# Patient Record
Sex: Male | Born: 1955 | Race: White | Hispanic: No | Marital: Married | Smoking: Never smoker
Health system: Southern US, Community
[De-identification: ages and names within clinical notes are randomized; demographics above are authoritative.]

## PROBLEM LIST (undated history)

## (undated) DIAGNOSIS — R51 Headache: Secondary | ICD-10-CM

## (undated) DIAGNOSIS — R519 Headache, unspecified: Secondary | ICD-10-CM

## (undated) HISTORY — PX: COLONOSCOPY: SHX5424

---

## 2006-12-15 ENCOUNTER — Emergency Department (HOSPITAL_COMMUNITY): Admission: EM | Admit: 2006-12-15 | Discharge: 2006-12-15 | Payer: Self-pay | Admitting: Emergency Medicine

## 2006-12-21 ENCOUNTER — Ambulatory Visit (HOSPITAL_COMMUNITY): Admission: RE | Admit: 2006-12-21 | Discharge: 2006-12-21 | Payer: Self-pay | Admitting: Family Medicine

## 2007-01-15 ENCOUNTER — Ambulatory Visit (HOSPITAL_COMMUNITY): Admission: RE | Admit: 2007-01-15 | Discharge: 2007-01-15 | Payer: Self-pay | Admitting: Family Medicine

## 2008-07-15 IMAGING — US US ABDOMEN COMPLETE
1 series · 14 of 25 positions shown · non-contrast
Comparison: 12/21/06 CT.

CLINICAL DATA: 50-year-old male, right upper quadrant abdominal pain.  
 ABDOMEN ULTRASOUND:
TECHNIQUE: Complete abdominal ultrasound examination was performed including evaluation of the liver, gallbladder, bile ducts, pancreas, kidneys, spleen, IVC, and abdominal aorta.

[Series 1: abdomen · 0.28mm/px · 14 of 68 slices shown]
[im 1/68]
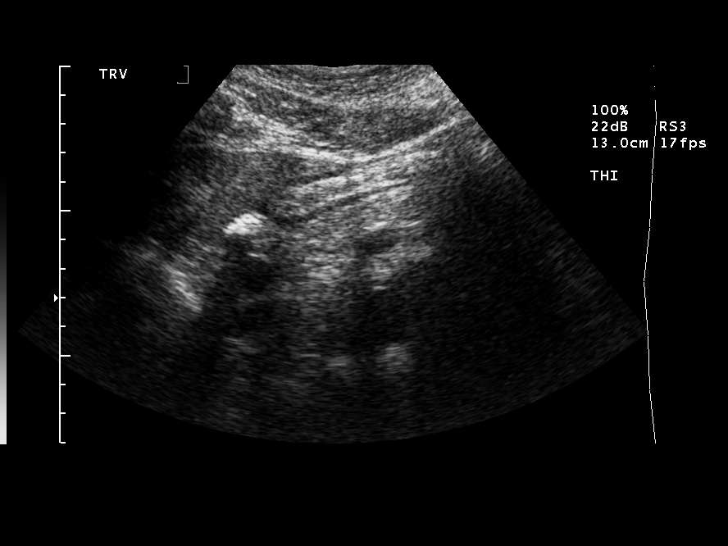
[im 6/68]
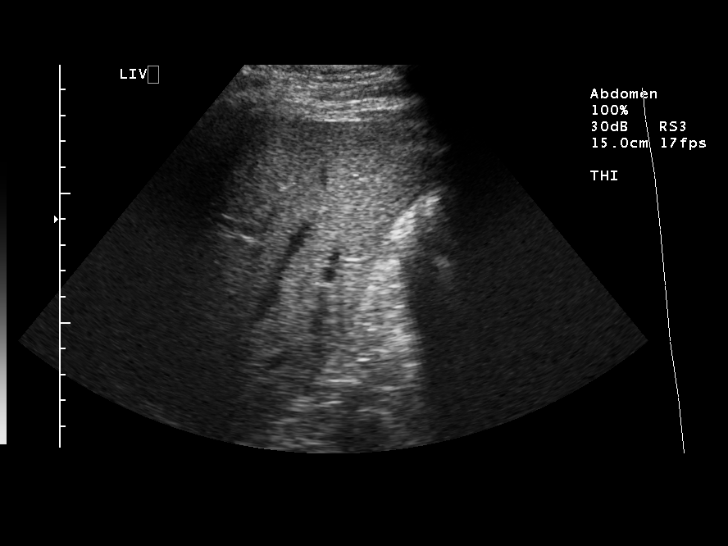
[im 12/68]
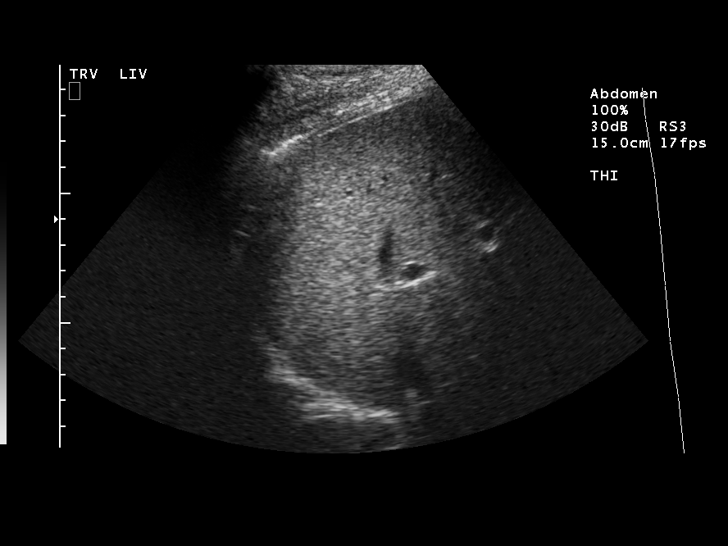
[im 17/68]
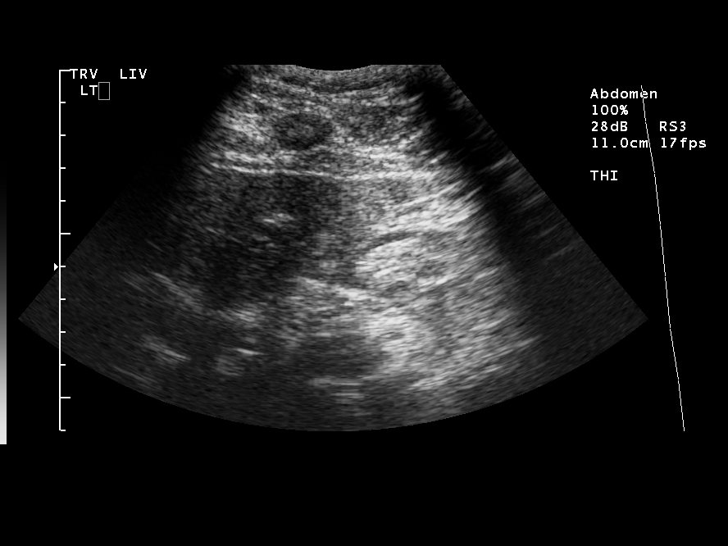
[im 23/68]
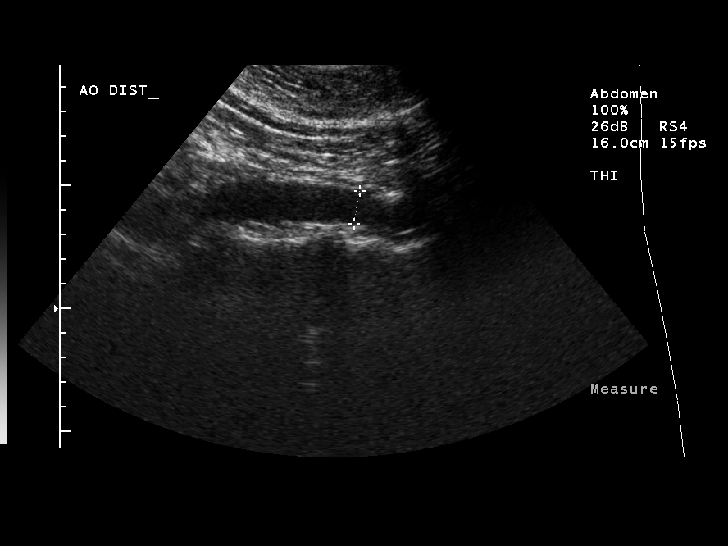
[im 26/68]
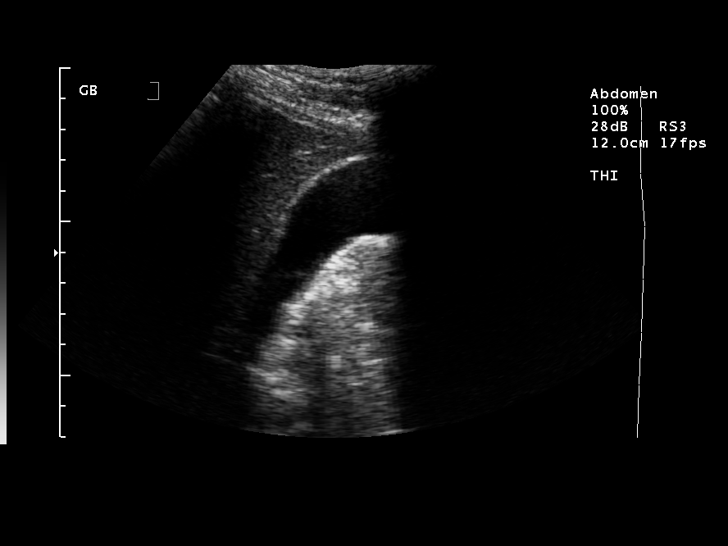
[im 31/68]
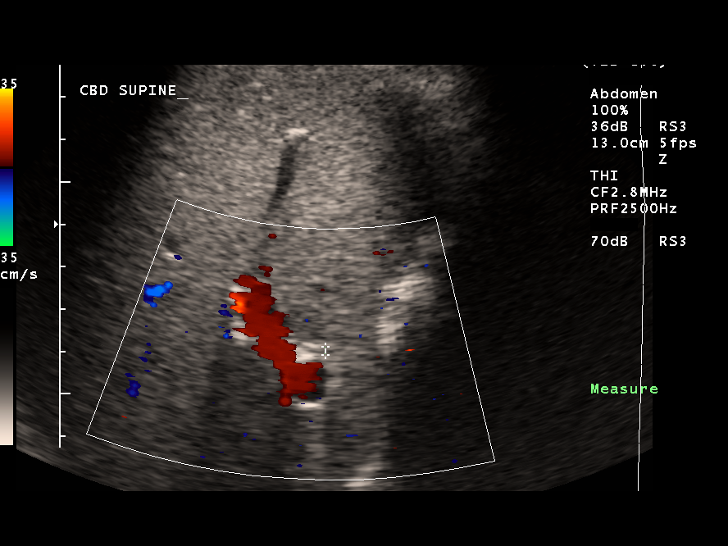
[im 37/68]
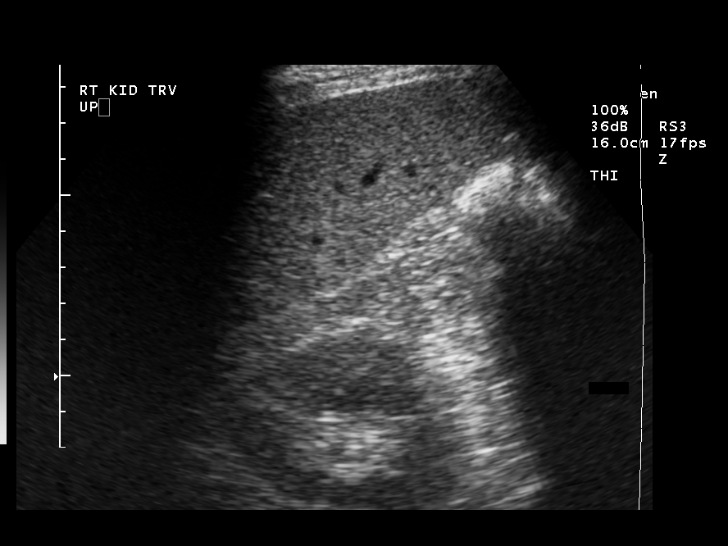
[im 42/68]
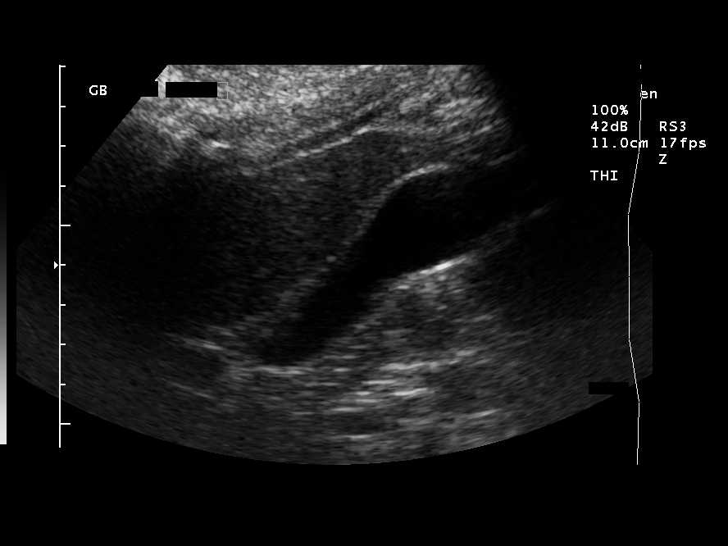
[im 45/68]
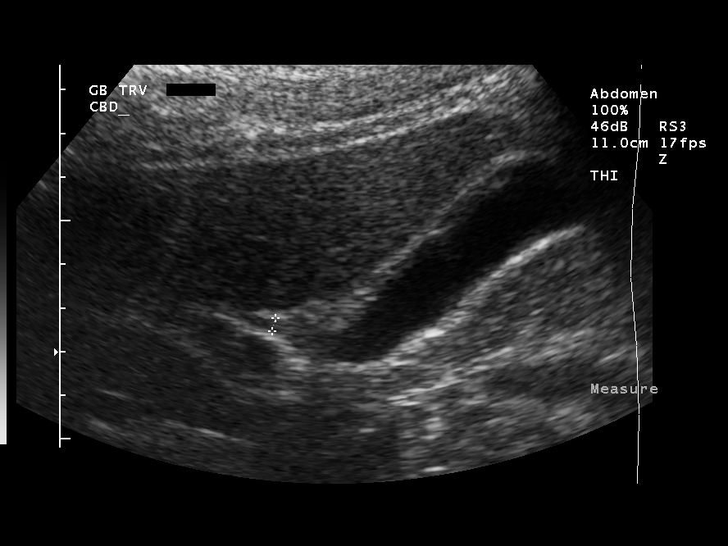
[im 51/68]
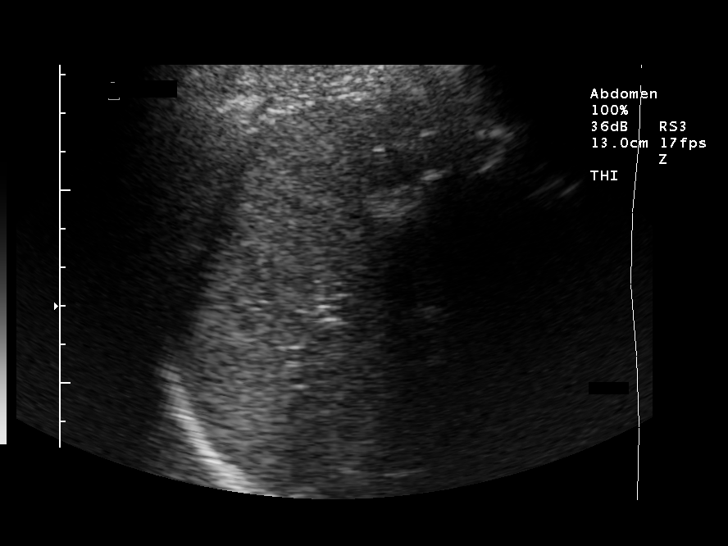
[im 56/68]
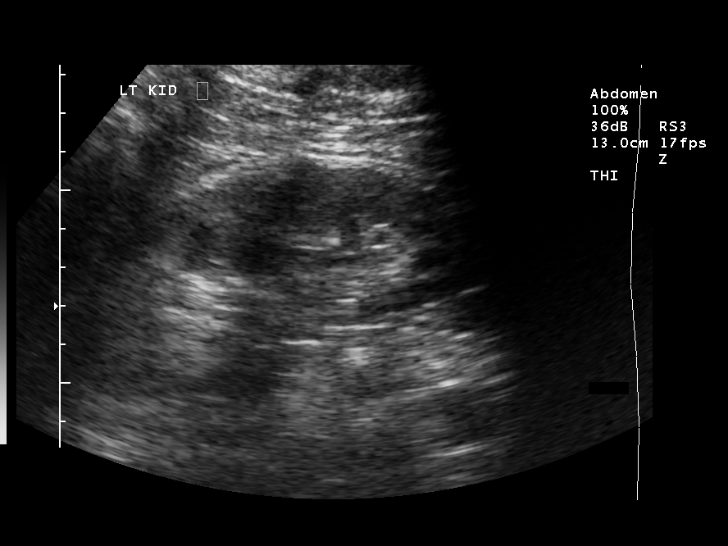
[im 62/68]
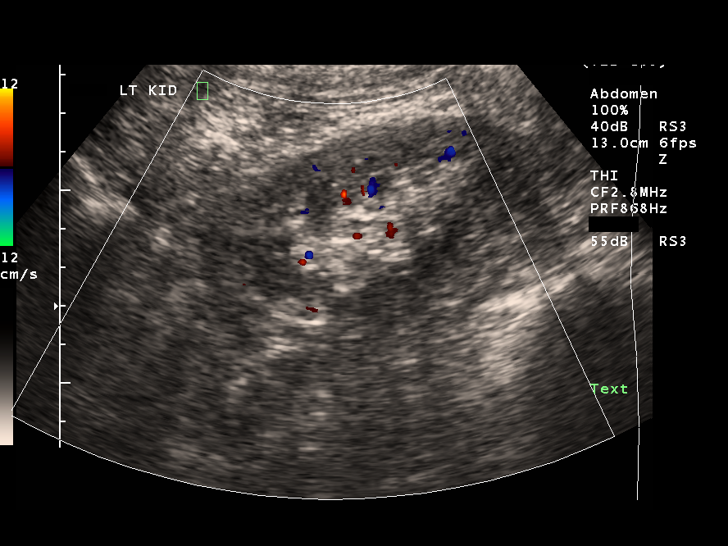
[im 68/68]
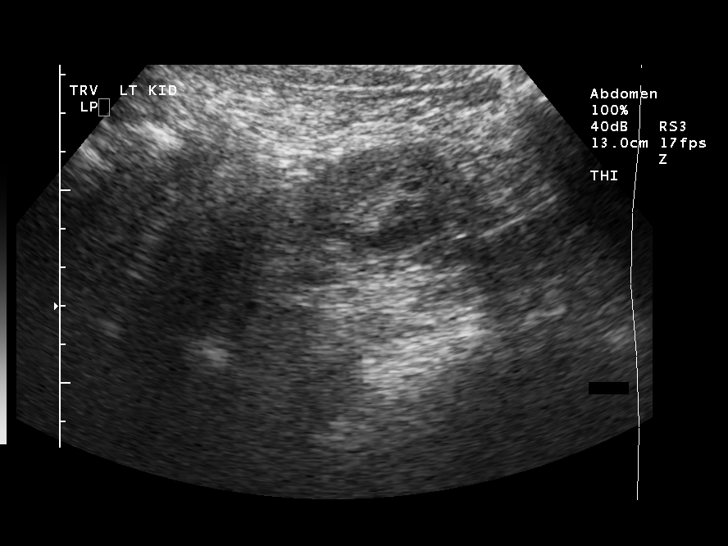

[14 of 25 positions shown; findings below may reference images not displayed]

FINDINGS: There is no evidence of gallstones or biliary ductal dilatation.  The liver is within normal limits in echogenicity, and no focal liver lesions are seen.  The visualized portions of the IVC and pancreas are unremarkable.
 There is no evidence of splenomegaly.  The kidneys are unremarkable, and there is no evidence of hydronephrosis.  The abdominal aorta is non-dilated.
IMPRESSION: Negative abdominal ultrasound.

## 2016-04-20 ENCOUNTER — Ambulatory Visit: Payer: Self-pay | Admitting: Physician Assistant

## 2016-05-09 ENCOUNTER — Encounter (HOSPITAL_COMMUNITY): Payer: Self-pay

## 2016-05-09 ENCOUNTER — Encounter (HOSPITAL_COMMUNITY)
Admission: RE | Admit: 2016-05-09 | Discharge: 2016-05-09 | Disposition: A | Discharge status: Home / Self Care | Payer: Commercial Managed Care - HMO | Source: Ambulatory Visit | Attending: Orthopedic Surgery | Admitting: Orthopedic Surgery

## 2016-05-09 DIAGNOSIS — M4722 Other spondylosis with radiculopathy, cervical region: Secondary | ICD-10-CM | POA: Diagnosis not present

## 2016-05-09 DIAGNOSIS — Z01812 Encounter for preprocedural laboratory examination: Secondary | ICD-10-CM | POA: Diagnosis present

## 2016-05-09 HISTORY — DX: Headache: R51

## 2016-05-09 HISTORY — DX: Headache, unspecified: R51.9

## 2016-05-09 LAB — BASIC METABOLIC PANEL
Anion gap: 7 (ref 5–15)
BUN: 11 mg/dL (ref 6–20)
CHLORIDE: 105 mmol/L (ref 101–111)
CO2: 27 mmol/L (ref 22–32)
Calcium: 9.5 mg/dL (ref 8.9–10.3)
Creatinine, Ser: 0.98 mg/dL (ref 0.61–1.24)
GFR calc non Af Amer: >60 mL/min (ref >60)
Glucose, Bld: 93 mg/dL (ref 65–99)
POTASSIUM: 4.7 mmol/L (ref 3.5–5.1)
SODIUM: 139 mmol/L (ref 135–145)

## 2016-05-09 LAB — CBC
HCT: 45.8 % (ref 39.0–52.0)
HEMOGLOBIN: 15.6 g/dL (ref 13.0–17.0)
MCH: 32.4 pg (ref 26.0–34.0)
MCHC: 34.1 g/dL (ref 30.0–36.0)
MCV: 95.2 fL (ref 78.0–100.0)
Platelets: 188 10*3/uL (ref 150–400)
RBC: 4.81 MIL/uL (ref 4.22–5.81)
RDW: 12.4 % (ref 11.5–15.5)
WBC: 8.1 10*3/uL (ref 4.0–10.5)

## 2016-05-09 LAB — SURGICAL PCR SCREEN
MRSA, PCR: NEGATIVE
STAPHYLOCOCCUS AUREUS: NEGATIVE

## 2016-05-09 NOTE — Progress Notes (Signed)
Mr Nichol's BP was 140/90 today, patient reports that it runs a little high.  PCP has recommended blood pressure pills, but patient refuses. PCP is Dr Wonda [facility], [facility], [place], [place].

## 2016-05-09 NOTE — Pre-Procedure Instructions (Signed)
    Cooper RenderCharles Noblett  05/09/2016      Your procedure is scheduled on Wednesday, August 16.   [facility]mitting at 6:30 AM              Your surgery or procedure is scheduled for 8:30 AM   Call this number if you have problems the morning of [phone]                   For any other questions, please call [phone], Monday - Friday 8 AM - 4 PM.   Remember:  Do not eat food or drink liquids after midnight Tuesday, August 15.  Take these medicines the morning of surgery with A SIP OF WATER :  Make take Bacloen if needed.                   Do not wear jewelry, make-up or nail polish.  Do not wear lotions, powders, or perfumes.     Men may shave face and neck.  Do not bring valuables to the hospital.  [facility] is not responsible for any belongings or valuables.  Contacts, dentures or bridgework may not be worn into surgery.  Leave your suitcase in the car.  After surgery it may be brought to your room.  For patients admitted to the hospital, discharge time will be determined by your treatment team.   Special instructions: Review  [place] - Preparing For Surgery.  Please read over the following fact sheets that you were given. Pain Booklet and Coughing and Deep Breathing and Review  [place] - Preparing For Surgery.

## 2016-05-10 ENCOUNTER — Other Ambulatory Visit (HOSPITAL_COMMUNITY): Payer: Self-pay

## 2016-05-16 MED ORDER — CEFAZOLIN SODIUM-DEXTROSE 2-4 GM/100ML-% IV SOLN
2.0000 g | INTRAVENOUS | Status: AC
  Administered 2016-05-17: 2 g via INTRAVENOUS
  Filled 2016-05-16: qty 100

## 2016-05-16 NOTE — Anesthesia Preprocedure Evaluation (Addendum)
Anesthesia Evaluation  Patient identified by MRN, date of birth, ID band Patient awake    Reviewed: Allergy & Precautions, NPO status , Patient's Chart, lab work & pertinent test results  Airway Mallampati: II  TM Distance: >3 FB Neck ROM: Limited    Dental no notable dental hx.    Pulmonary neg pulmonary ROS,    Pulmonary exam normal        Cardiovascular hypertension (At previous family medicine office visit, patient refused BP controll medications), Normal cardiovascular exam     Neuro/Psych  Headaches,    GI/Hepatic negative GI ROS, Neg liver ROS,   Endo/Other  negative endocrine ROS  Renal/GU negative Renal ROS     Musculoskeletal negative musculoskeletal ROS (+)   Abdominal   Peds  Hematology negative hematology ROS (+)   Anesthesia Other Findings Day of surgery medications reviewed with the patient.  Reproductive/Obstetrics                            Anesthesia Physical Anesthesia Plan  ASA: II  Anesthesia Plan: General   Post-op Pain Management:    Induction: Intravenous  Airway Management Planned: Oral ETT  Additional Equipment:   Intra-op Plan:   Post-operative Plan: Extubation in OR  Informed Consent: I have reviewed the patients History and Physical, chart, labs and discussed the procedure including the risks, benefits and alternatives for the proposed anesthesia with the patient or authorized representative who has indicated his/her understanding and acceptance.   Dental advisory given  Plan Discussed with: Surgeon and CRNA  Anesthesia Plan Comments:        Anesthesia Quick Evaluation

## 2016-05-17 ENCOUNTER — Ambulatory Visit (HOSPITAL_COMMUNITY): Payer: Commercial Managed Care - HMO | Admitting: Anesthesiology

## 2016-05-17 ENCOUNTER — Encounter (HOSPITAL_COMMUNITY): Payer: Self-pay | Admitting: *Deleted

## 2016-05-17 ENCOUNTER — Observation Stay (HOSPITAL_COMMUNITY)
Admission: RE | Admit: 2016-05-17 | Discharge: 2016-05-18 | Disposition: A | Discharge status: Home / Self Care | Payer: Commercial Managed Care - HMO | Source: Ambulatory Visit | Attending: Orthopedic Surgery | Admitting: Orthopedic Surgery

## 2016-05-17 ENCOUNTER — Ambulatory Visit (HOSPITAL_COMMUNITY)
Admission: RE | Disposition: A | Discharge status: Home / Self Care | Payer: Self-pay | Source: Ambulatory Visit | Attending: Orthopedic Surgery

## 2016-05-17 ENCOUNTER — Observation Stay (HOSPITAL_COMMUNITY): Payer: Commercial Managed Care - HMO

## 2016-05-17 ENCOUNTER — Ambulatory Visit (HOSPITAL_COMMUNITY): Payer: Commercial Managed Care - HMO

## 2016-05-17 DIAGNOSIS — Z419 Encounter for procedure for purposes other than remedying health state, unspecified: Secondary | ICD-10-CM

## 2016-05-17 DIAGNOSIS — M4722 Other spondylosis with radiculopathy, cervical region: Principal | ICD-10-CM | POA: Insufficient documentation

## 2016-05-17 DIAGNOSIS — Z981 Arthrodesis status: Secondary | ICD-10-CM

## 2016-05-17 DIAGNOSIS — M542 Cervicalgia: Secondary | ICD-10-CM | POA: Diagnosis present

## 2016-05-17 DIAGNOSIS — I10 Essential (primary) hypertension: Secondary | ICD-10-CM | POA: Diagnosis not present

## 2016-05-17 DIAGNOSIS — M50123 Cervical disc disorder at C6-C7 level with radiculopathy: Secondary | ICD-10-CM | POA: Diagnosis not present

## 2016-05-17 HISTORY — PX: ANTERIOR CERVICAL DECOMP/DISCECTOMY FUSION: SHX1161

## 2016-05-17 SURGERY — ANTERIOR CERVICAL DECOMPRESSION/DISCECTOMY FUSION 2 LEVELS
Anesthesia: General | Site: Spine Cervical

## 2016-05-17 MED ORDER — ONDANSETRON HCL 4 MG PO TABS: 4.0000 mg | ORAL_TABLET | Freq: Three times a day (TID) | ORAL | 0 refills | Status: AC | PRN

## 2016-05-17 MED ORDER — OXYCODONE HCL 5 MG/5ML PO SOLN: 5.0000 mg | Freq: Once | ORAL | Status: DC | PRN

## 2016-05-17 MED ORDER — EPHEDRINE SULFATE 50 MG/ML IJ SOLN
INTRAMUSCULAR | Status: DC | PRN
  Administered 2016-05-17 (×2): 5 mg via INTRAVENOUS
  Administered 2016-05-17: 10 mg via INTRAVENOUS

## 2016-05-17 MED ORDER — LIDOCAINE 2% (20 MG/ML) 5 ML SYRINGE
INTRAMUSCULAR | Status: AC
  Filled 2016-05-17: qty 5

## 2016-05-17 MED ORDER — MIDAZOLAM HCL 2 MG/2ML IJ SOLN
INTRAMUSCULAR | Status: DC | PRN
  Administered 2016-05-17 (×2): 1 mg via INTRAVENOUS

## 2016-05-17 MED ORDER — THROMBIN 20000 UNITS EX SOLR
CUTANEOUS | Status: AC
  Filled 2016-05-17: qty 20000

## 2016-05-17 MED ORDER — DEXAMETHASONE SODIUM PHOSPHATE 10 MG/ML IJ SOLN
INTRAMUSCULAR | Status: DC | PRN
  Administered 2016-05-17: 10 mg via INTRAVENOUS

## 2016-05-17 MED ORDER — LIDOCAINE HCL (CARDIAC) 20 MG/ML IV SOLN
INTRAVENOUS | Status: DC | PRN
Start: 2016-05-17 — End: 2016-05-17
  Administered 2016-05-17: 100 mg via INTRATRACHEAL

## 2016-05-17 MED ORDER — HYDROMORPHONE HCL 1 MG/ML IJ SOLN
INTRAMUSCULAR | Status: AC
  Filled 2016-05-17: qty 1

## 2016-05-17 MED ORDER — SODIUM CHLORIDE 0.9 % IV SOLN: 250.0000 mL | INTRAVENOUS | Status: DC

## 2016-05-17 MED ORDER — SUCCINYLCHOLINE CHLORIDE 200 MG/10ML IV SOSY
PREFILLED_SYRINGE | INTRAVENOUS | Status: AC
Start: 2016-05-17 — End: 2016-05-17
  Filled 2016-05-17: qty 10

## 2016-05-17 MED ORDER — SUGAMMADEX SODIUM 200 MG/2ML IV SOLN
INTRAVENOUS | Status: DC | PRN
  Administered 2016-05-17: 200 mg via INTRAVENOUS

## 2016-05-17 MED ORDER — ONDANSETRON HCL 4 MG/2ML IJ SOLN
4.0000 mg | INTRAMUSCULAR | Status: DC | PRN
  Administered 2016-05-17: 4 mg via INTRAVENOUS
  Filled 2016-05-17: qty 2

## 2016-05-17 MED ORDER — GLYCOPYRROLATE 0.2 MG/ML IJ SOLN
INTRAMUSCULAR | Status: DC | PRN
  Administered 2016-05-17 (×2): 0.2 mg via INTRAVENOUS

## 2016-05-17 MED ORDER — ACETAMINOPHEN 10 MG/ML IV SOLN
INTRAVENOUS | Status: AC
  Filled 2016-05-17: qty 100

## 2016-05-17 MED ORDER — EPHEDRINE SULFATE 50 MG/ML IJ SOLN
INTRAMUSCULAR | Status: AC
  Filled 2016-05-17: qty 1

## 2016-05-17 MED ORDER — METHOCARBAMOL 500 MG PO TABS: 500.0000 mg | ORAL_TABLET | Freq: Three times a day (TID) | ORAL | 0 refills | Status: AC | PRN

## 2016-05-17 MED ORDER — CEFAZOLIN IN D5W 1 GM/50ML IV SOLN
1.0000 g | Freq: Three times a day (TID) | INTRAVENOUS | Status: AC
  Administered 2016-05-17 (×2): 1 g via INTRAVENOUS
  Filled 2016-05-17 (×2): qty 50

## 2016-05-17 MED ORDER — OXYCODONE-ACETAMINOPHEN 10-325 MG PO TABS: 1.0000 | ORAL_TABLET | ORAL | 0 refills | Status: AC | PRN

## 2016-05-17 MED ORDER — OXYCODONE HCL 5 MG PO TABS: 5.0000 mg | ORAL_TABLET | Freq: Once | ORAL | Status: DC | PRN

## 2016-05-17 MED ORDER — LACTATED RINGERS IV SOLN
INTRAVENOUS | Status: DC | PRN
  Administered 2016-05-17 (×2): via INTRAVENOUS

## 2016-05-17 MED ORDER — ACETAMINOPHEN 10 MG/ML IV SOLN
INTRAVENOUS | Status: DC | PRN
  Administered 2016-05-17: 1000 mg via INTRAVENOUS

## 2016-05-17 MED ORDER — ROCURONIUM BROMIDE 10 MG/ML (PF) SYRINGE
PREFILLED_SYRINGE | INTRAVENOUS | Status: AC
  Filled 2016-05-17: qty 10

## 2016-05-17 MED ORDER — DEXAMETHASONE 4 MG PO TABS
4.0000 mg | ORAL_TABLET | Freq: Four times a day (QID) | ORAL | Status: DC
  Administered 2016-05-17 – 2016-05-18 (×2): 4 mg via ORAL
  Filled 2016-05-17 (×2): qty 1

## 2016-05-17 MED ORDER — PROPOFOL 10 MG/ML IV BOLUS
INTRAVENOUS | Status: AC
  Filled 2016-05-17: qty 20

## 2016-05-17 MED ORDER — BUPIVACAINE-EPINEPHRINE (PF) 0.25% -1:200000 IJ SOLN
INTRAMUSCULAR | Status: AC
  Filled 2016-05-17: qty 30

## 2016-05-17 MED ORDER — MENTHOL 3 MG MT LOZG: 1.0000 | LOZENGE | OROMUCOSAL | Status: DC | PRN

## 2016-05-17 MED ORDER — DEXAMETHASONE SODIUM PHOSPHATE 4 MG/ML IJ SOLN
4.0000 mg | Freq: Four times a day (QID) | INTRAMUSCULAR | Status: DC
  Administered 2016-05-17 (×2): 4 mg via INTRAVENOUS
  Filled 2016-05-17 (×2): qty 1

## 2016-05-17 MED ORDER — METHOCARBAMOL 500 MG PO TABS
500.0000 mg | ORAL_TABLET | Freq: Four times a day (QID) | ORAL | Status: DC | PRN
  Administered 2016-05-17 – 2016-05-18 (×2): 500 mg via ORAL
  Filled 2016-05-17 (×2): qty 1

## 2016-05-17 MED ORDER — HYDROMORPHONE HCL 1 MG/ML IJ SOLN
0.2500 mg | INTRAMUSCULAR | Status: DC | PRN
  Administered 2016-05-17 (×3): 0.5 mg via INTRAVENOUS

## 2016-05-17 MED ORDER — ACETAMINOPHEN 325 MG PO TABS
650.0000 mg | ORAL_TABLET | ORAL | Status: DC | PRN
  Administered 2016-05-17 – 2016-05-18 (×2): 650 mg via ORAL
  Filled 2016-05-17 (×2): qty 2

## 2016-05-17 MED ORDER — MIDAZOLAM HCL 2 MG/2ML IJ SOLN
INTRAMUSCULAR | Status: AC
  Filled 2016-05-17: qty 2

## 2016-05-17 MED ORDER — FENTANYL CITRATE (PF) 100 MCG/2ML IJ SOLN
INTRAMUSCULAR | Status: AC
  Filled 2016-05-17: qty 6

## 2016-05-17 MED ORDER — GELATIN ABSORBABLE 100 EX MISC
CUTANEOUS | Status: DC | PRN
  Administered 2016-05-17: 20 mL via TOPICAL

## 2016-05-17 MED ORDER — SUGAMMADEX SODIUM 200 MG/2ML IV SOLN
INTRAVENOUS | Status: AC
  Filled 2016-05-17: qty 2

## 2016-05-17 MED ORDER — FENTANYL CITRATE (PF) 100 MCG/2ML IJ SOLN
INTRAMUSCULAR | Status: DC | PRN
  Administered 2016-05-17 (×2): 150 ug via INTRAVENOUS

## 2016-05-17 MED ORDER — BUPIVACAINE-EPINEPHRINE 0.25% -1:200000 IJ SOLN
INTRAMUSCULAR | Status: DC | PRN
  Administered 2016-05-17: 10 mL

## 2016-05-17 MED ORDER — SODIUM CHLORIDE 0.9% FLUSH
3.0000 mL | Freq: Two times a day (BID) | INTRAVENOUS | Status: DC
  Administered 2016-05-17 (×2): 3 mL via INTRAVENOUS

## 2016-05-17 MED ORDER — PHENOL 1.4 % MT LIQD: 1.0000 | OROMUCOSAL | Status: DC | PRN

## 2016-05-17 MED ORDER — OXYCODONE HCL 5 MG PO TABS
10.0000 mg | ORAL_TABLET | ORAL | Status: DC | PRN
  Administered 2016-05-17 – 2016-05-18 (×5): 10 mg via ORAL
  Filled 2016-05-17 (×5): qty 2

## 2016-05-17 MED ORDER — MORPHINE SULFATE (PF) 2 MG/ML IV SOLN
1.0000 mg | INTRAVENOUS | Status: DC | PRN
  Administered 2016-05-17: 2 mg via INTRAVENOUS
  Filled 2016-05-17: qty 1

## 2016-05-17 MED ORDER — ONDANSETRON HCL 4 MG/2ML IJ SOLN
INTRAMUSCULAR | Status: AC
  Filled 2016-05-17: qty 2

## 2016-05-17 MED ORDER — PROPOFOL 1000 MG/100ML IV EMUL
INTRAVENOUS | Status: AC
  Filled 2016-05-17: qty 100

## 2016-05-17 MED ORDER — LACTATED RINGERS IV SOLN
INTRAVENOUS | Status: DC | PRN
  Administered 2016-05-17: 08:00:00 via INTRAVENOUS

## 2016-05-17 MED ORDER — PHENYLEPHRINE HCL 10 MG/ML IJ SOLN
INTRAVENOUS | Status: DC | PRN
  Administered 2016-05-17: 25 ug/min via INTRAVENOUS

## 2016-05-17 MED ORDER — PROMETHAZINE HCL 25 MG/ML IJ SOLN: 6.2500 mg | INTRAMUSCULAR | Status: DC | PRN

## 2016-05-17 MED ORDER — ONDANSETRON HCL 4 MG/2ML IJ SOLN
INTRAMUSCULAR | Status: DC | PRN
  Administered 2016-05-17: 4 mg via INTRAVENOUS

## 2016-05-17 MED ORDER — ARTIFICIAL TEARS OP OINT
TOPICAL_OINTMENT | OPHTHALMIC | Status: DC | PRN
  Administered 2016-05-17: 1 via OPHTHALMIC

## 2016-05-17 MED ORDER — PROPOFOL 10 MG/ML IV BOLUS
INTRAVENOUS | Status: DC | PRN
  Administered 2016-05-17: 200 mg via INTRAVENOUS

## 2016-05-17 MED ORDER — ROCURONIUM BROMIDE 100 MG/10ML IV SOLN
INTRAVENOUS | Status: DC | PRN
  Administered 2016-05-17: 50 mg via INTRAVENOUS
  Administered 2016-05-17 (×2): 25 mg via INTRAVENOUS

## 2016-05-17 MED ORDER — ARTIFICIAL TEARS OP OINT
TOPICAL_OINTMENT | OPHTHALMIC | Status: AC
  Filled 2016-05-17: qty 3.5

## 2016-05-17 MED ORDER — METHOCARBAMOL 1000 MG/10ML IJ SOLN
500.0000 mg | Freq: Four times a day (QID) | INTRAVENOUS | Status: DC | PRN
  Filled 2016-05-17: qty 5

## 2016-05-17 MED ORDER — SODIUM CHLORIDE 0.9% FLUSH: 3.0000 mL | INTRAVENOUS | Status: DC | PRN

## 2016-05-17 MED ORDER — LACTATED RINGERS IV SOLN
INTRAVENOUS | Status: DC
Start: 2016-05-17 — End: 2016-05-18

## 2016-05-17 MED ORDER — DEXAMETHASONE SODIUM PHOSPHATE 10 MG/ML IJ SOLN
INTRAMUSCULAR | Status: AC
  Filled 2016-05-17: qty 1

## 2016-05-17 MED ORDER — GLYCOPYRROLATE 0.2 MG/ML IV SOSY
PREFILLED_SYRINGE | INTRAVENOUS | Status: AC
  Filled 2016-05-17: qty 3

## 2016-05-17 MED ORDER — PROPOFOL 500 MG/50ML IV EMUL
INTRAVENOUS | Status: DC | PRN
  Administered 2016-05-17: 25 ug/kg/min via INTRAVENOUS

## 2016-05-17 SURGICAL SUPPLY — 68 items
BIT DRILL SKYLINE 16MM (BIT) ×1 IMPLANT
BLADE SURG ROTATE 9660 (MISCELLANEOUS) IMPLANT
BUR EGG ELITE 4.0 (BURR) IMPLANT
BUR MATCHSTICK NEURO 3.0 LAGG (BURR) IMPLANT
CANISTER SUCTION 2500CC (MISCELLANEOUS) ×2 IMPLANT
CLSR STERI-STRIP ANTIMIC 1/2X4 (GAUZE/BANDAGES/DRESSINGS) ×2 IMPLANT
CORDS BIPOLAR (ELECTRODE) ×2 IMPLANT
COVER SURGICAL LIGHT HANDLE (MISCELLANEOUS) ×4 IMPLANT
CRADLE DONUT ADULT HEAD (MISCELLANEOUS) ×2 IMPLANT
DEVICE EDNSKLTN TC NNLCK MED 8 (Neuro Prosthesis/Implant) IMPLANT
DRAPE C-ARM 42X72 X-RAY (DRAPES) ×2 IMPLANT
DRAPE POUCH INSTRU U-SHP 10X18 (DRAPES) ×2 IMPLANT
DRAPE SURG 17X23 STRL (DRAPES) ×2 IMPLANT
DRAPE U-SHAPE 47X51 STRL (DRAPES) ×2 IMPLANT
DRILL SKYLINE 16MM (BIT) ×2
DRSG MEPILEX BORDER 4X4 (GAUZE/BANDAGES/DRESSINGS) ×2 IMPLANT
DURAPREP 6ML APPLICATOR 50/CS (WOUND CARE) ×2 IMPLANT
ELECT COATED BLADE 2.86 ST (ELECTRODE) ×2 IMPLANT
ELECT PENCIL ROCKER SW 15FT (MISCELLANEOUS) ×2 IMPLANT
ELECT REM PT RETURN 9FT ADLT (ELECTROSURGICAL) ×2
ELECTRODE REM PT RTRN 9FT ADLT (ELECTROSURGICAL) ×1 IMPLANT
ENDOSKELTON IMPLANT TC MED 8MM (Neuro Prosthesis/Implant) ×4 IMPLANT
GLOVE BIO SURGEON STRL SZ 6.5 (GLOVE) ×4 IMPLANT
GLOVE BIOGEL PI IND STRL 6.5 (GLOVE) ×2 IMPLANT
GLOVE BIOGEL PI IND STRL 8.5 (GLOVE) ×1 IMPLANT
GLOVE BIOGEL PI INDICATOR 6.5 (GLOVE) ×2
GLOVE BIOGEL PI INDICATOR 8.5 (GLOVE) ×1
GLOVE SS BIOGEL STRL SZ 8.5 (GLOVE) ×1 IMPLANT
GLOVE SUPERSENSE BIOGEL SZ 8.5 (GLOVE) ×1
GLOVE SURG SS PI 6.5 STRL IVOR (GLOVE) ×3 IMPLANT
GOWN STRL REUS W/ TWL XL LVL3 (GOWN DISPOSABLE) ×2 IMPLANT
GOWN STRL REUS W/TWL 2XL LVL3 (GOWN DISPOSABLE) ×4 IMPLANT
GOWN STRL REUS W/TWL XL LVL3 (GOWN DISPOSABLE) ×6
KIT BASIN OR (CUSTOM PROCEDURE TRAY) ×2 IMPLANT
KIT ROOM TURNOVER OR (KITS) ×2 IMPLANT
NEEDLE SPNL 18GX3.5 QUINCKE PK (NEEDLE) ×2 IMPLANT
NS IRRIG 1000ML POUR BTL (IV SOLUTION) ×2 IMPLANT
PACK ORTHO CERVICAL (CUSTOM PROCEDURE TRAY) ×2 IMPLANT
PACK UNIVERSAL I (CUSTOM PROCEDURE TRAY) ×2 IMPLANT
PAD ARMBOARD 7.5X6 YLW CONV (MISCELLANEOUS) ×4 IMPLANT
PATTIES SURGICAL .25X.25 (GAUZE/BANDAGES/DRESSINGS) ×2 IMPLANT
PIN DISTRACTION 14 (PIN) ×4 IMPLANT
PLATE SKYLINE 2 LEVEL 34MM (Plate) ×1 IMPLANT
PUTTY BONE DBX 5CC MIX (Putty) ×2 IMPLANT
RESTRAINT LIMB HOLDER UNIV (RESTRAINTS) ×2 IMPLANT
SCREW RESCUE SKYLINE 16MM (Screw) ×1 IMPLANT
SCREW SKYLINE 14MM SD-VA (Screw) ×4 IMPLANT
SCREW SKYLINE 16MM (Screw) ×2 IMPLANT
SCREW SKYLINE 18MM (SET/KITS/TRAYS/PACK) ×1 IMPLANT
SPONGE INTESTINAL PEANUT (DISPOSABLE) ×2 IMPLANT
SPONGE LAP 4X18 X RAY DECT (DISPOSABLE) ×4 IMPLANT
SPONGE SURGIFOAM ABS GEL 100 (HEMOSTASIS) ×2 IMPLANT
STRIP CLOSURE SKIN 1/2X4 (GAUZE/BANDAGES/DRESSINGS) ×1 IMPLANT
SURGIFLO W/THROMBIN 8M KIT (HEMOSTASIS) IMPLANT
SUT BONE WAX W31G (SUTURE) ×2 IMPLANT
SUT MON AB 3-0 SH 27 (SUTURE) ×2
SUT MON AB 3-0 SH27 (SUTURE) ×1 IMPLANT
SUT SILK 2 0 (SUTURE)
SUT SILK 2-0 18XBRD TIE 12 (SUTURE) IMPLANT
SUT VIC AB 2-0 CT1 18 (SUTURE) ×2 IMPLANT
SYR BULB IRRIGATION 50ML (SYRINGE) ×2 IMPLANT
SYR CONTROL 10ML LL (SYRINGE) ×2 IMPLANT
TAPE CLOTH 4X10 WHT NS (GAUZE/BANDAGES/DRESSINGS) ×2 IMPLANT
TAPE UMBILICAL COTTON 1/8X30 (MISCELLANEOUS) ×2 IMPLANT
TOWEL OR 17X24 6PK STRL BLUE (TOWEL DISPOSABLE) ×2 IMPLANT
TOWEL OR 17X26 10 PK STRL BLUE (TOWEL DISPOSABLE) ×2 IMPLANT
WATER STERILE IRR 1000ML POUR (IV SOLUTION) ×2 IMPLANT
YANKAUER SUCT BULB TIP NO VENT (SUCTIONS) ×1 IMPLANT

## 2016-05-17 NOTE — Transfer of Care (Signed)
Immediate Anesthesia Transfer of Care Note  Patient: Cooper RenderCharles Kapaun  Procedure(s) Performed: Procedure(s): Anterior Cervical Discectomy and Fusion Cervical five through Cervical Seven (N/A)  Patient Location: PACU  Anesthesia Type:General  Level of Consciousness: awake, alert  and patient cooperative  Airway & Oxygen Therapy: Patient Spontanous Breathing and Patient connected to face mask oxygen  Post-op Assessment: Report given to RN, Post -op Vital signs reviewed and stable, Patient moving all extremities X 4 and Patient able to stick tongue midline  Post vital signs: Reviewed and stable  Last Vitals:  Vitals:   05/17/16 0751 05/17/16 1154  BP: (!) 171/102 (!) 163/95  Pulse: (!) 56 (!) 125  Resp: 20 18  Temp: 36.7 C 36.6 C    Last Pain:  Vitals:   05/17/16 1154  TempSrc:   PainSc: 0-No pain         Complications: No apparent anesthesia complications

## 2016-05-17 NOTE — Anesthesia Postprocedure Evaluation (Signed)
Anesthesia Post Note  Patient: Spencer Sampson  Procedure(s) Performed: Procedure(s) (LRB): Anterior Cervical Discectomy and Fusion Cervical five through Cervical Seven (N/A)  Patient location during evaluation: PACU Anesthesia Type: General Level of consciousness: awake and alert Pain management: pain level controlled Vital Signs Assessment: post-procedure vital signs reviewed and stable Respiratory status: spontaneous breathing, nonlabored ventilation, respiratory function stable and patient connected to nasal cannula oxygen Cardiovascular status: blood pressure returned to baseline and stable Postop Assessment: no signs of nausea or vomiting Anesthetic complications: no    Last Vitals:  Vitals:   05/17/16 1210 05/17/16 1215  BP: (!) 154/98   Pulse: (!) 109 (!) 108  Resp: 20 16  Temp:      Last Pain:  Vitals:   05/17/16 1215  TempSrc:   PainSc: 0-No pain                 Bonita Quinichard S Jalynne Persico

## 2016-05-17 NOTE — Op Note (Signed)
NAMCooper Render:  Spencer Sampson, Spencer Sampson                ACCOUNT NO.:  [id number]  MEDICAL RECORD NO.:  [id number]  LOCATION:  [facility]                         FACILITY:  [facility]  PHYSICIAN:  Tramya Schoenfelder D. Shon BatonBrooks, M.D. 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  POSTOPERATIVE DIAGNOSIS:  Cervical spondylitic radiculopathy, C5-6 and C6-7.  OPERATIVE PROCEDURE:  Anterior cervical diskectomy and fusion, C5-6, C6- 7.  IMPLANT SYSTEM USED:  Titan nanoLOCK intervertebral cage medium size 8 lordotic packed with DBX mix with DePuy Skyline anterior cervical plate 34 mm in length.  FIRST ASSISTANT:  Anette Riedelarmen [place], [place].  HISTORY:  This is a very pleasant 60 year old gentleman who is having progressive neck, scapular, and neuropathic right arm pain.  Attempts at conservative management have failed to alleviate his symptoms, so we elected to proceed with surgery.  All appropriate risks, benefits, and alternatives were discussed with the patient and consent was obtained.  OPERATIVE NOTE:  The patient was brought to the operating room and placed supine on the operating table.  After successful induction of general anesthesia and endotracheal intubation, TEDs and SCDs were applied.  A folded blanket was placed underneath the scapula, and the anterior cervical spine was prepped and draped in a standard fashion. Time-out was taken to confirm the patient, procedure, and all other pertinent important data.  Once this was done, fluoro was used in the lateral plane to identify the C6 vertebral body.  Incision site was marked out and infiltrated with 0.25% Marcaine.  An incision was made starting in the midline and proceeding to the left and sharp dissection was carried out down to and through the platysma.  I continued my dissection along the medial border of the  sternocleidomastoid performing standard Smith-Robinson approach through transverse incision.  The omohyoid was identified, isolated, and sacrificed for better visualization.  I then swept the trachea and esophagus to the right, palpated the carotid pulse, and protected that with a finger.  Using [employer]ittner dissectors, I removed the remaining prevertebral and deep cervical fascia to expose the anterior longitudinal ligament.  I then identified the C5-6 disk space, I placed a needle into it, and took my lateral fluoro view.  Once I confirmed I was at the C5-6 level, I then continued with my approach.  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  This underdeveloped disk space I still think this is C3 to correlate my intraoperative fluoro views with the preoperative MRI.  I mobilized the longus colli muscle from the midbody of C5 to the midbody of C7 using bipolar electrocautery.  I then removed the overhanging osteophytes from both disk space levels with a Leksell rongeur.  Once this was done, Caspar retracting blade was placed underneath the longus colli muscle.  The endotracheal cuff was deflated.  I expanded the retractor and reinflated the cuff.  Distraction pins were placed into the bodies of 6 and 7 and an annulotomy was performed with a 15-blade scalpel.  Gentle distraction was applied and then I continued removing the bulk of the disk material with pituitary rongeur.  I then used a 2 mm Kerrison punch to resect the overhanging osteophyte from the inferior aspect of the C6 vertebral body.  At this point, I then placed my interlaminar spreader in the disk space and I distracted the disk space, we maintained distraction with the pins.  This allowed me to continue my diskectomy using curettes down to the posterior annulus.  I used my 1 mm Kerrison and slipped it behind the posterior vertebral body of C7  and removed the posterior osteophyte from the C7 vertebral body.  I then did a similar maneuver at C6.  I then used my nerve hook to dissect through the posterior longitudinal ligament and then used my 1 mm Kerrison to resect this along with the posterior annulus.  This allowed me to get underneath the uncovertebral joint and perform a generous decompression of the foramen.  At this point, I had an excellent decompression of the disk space.  I rasped the endplates and then trialed with intervertebral spacers and elected to use a size 8 medium Titan nanoLOCK intervertebral cage.  This was packed with DBX mix and malleted to final position.  X- rays were satisfactory.  I removed the distraction pin from the body of C7, placed bone wax into the bleeding hole and then repositioned to the body of C5.  Using the same technique I had used at the 6-7 level, I performed the same diskectomy at C5-6.  Again I had excellent decompression foraminotomy.  I used the same size implant and malleted it with the DBX mix and malleted to the appropriate depth.  With both ACDF completed, I then removed all the retracting devices, ensured hemostasis, and applied a 34 mm anterior cervical plate, which I contoured to further lordosis of C7.  I fixed it with 16 mm screws into the body of C5 and 14 mm screws into the body of 6 and 7.  Upon final inspection, I noticed that the right C5 screw was malpositioned, so I removed it, drilled a new hole, and then placed an 18 mm length screw, which had excellent purchase in both the AP and lateral planes.  It was well within the vertebral body of C5.  I had excellent purchase and it was redirected in a much better position.  At this point, all the 6 screws were locked in place according to manufacturer standards.  I irrigated the wound copiously with normal saline and made sure hemostasis using bipolar electrocautery.  I then checked to ensure the esophagus did not become  inadvertently entrapped beneath the plate and then I returned it to the midline.  I closed the platysma with interrupted 2-0 Vicryl sutures and a 3-0 Monocryl for the skin.  Steri- Strips and dry dressing were applied and the patient was ultimately extubated and transferred to the PACU without incident.  At the end of the case, all needle and sponge counts were correct.  There were no adverse intraoperative events.     Tallia Moehring D. Shon BatonBrooks, M.D.     DDB/MEDQ  D:  05/17/2016  T:  05/17/2016  Job:  [id number]  cc:   [facility]

## 2016-05-17 NOTE — Evaluation (Signed)
Occupational Therapy Evaluation and Discharge Patient Details Name: Spencer Sampson MRN: [id number] DOB: October 09, 1955 Today's Date: 05/17/2016    History of Present Illness Pt is a 60 y.o. male s/p C5-7 ACDF. No pertinent PMHx in chart.   Clinical Impression   Pt reports he was independent with ADL PTA. Currently pt overall supervision for safety with ADL and functional mobility. Provided pt with all cervical, safety, and ADL education; pt and wife with no further questions or concerns for OT at this time. Pt planning to d/c home with supervision from family. No further acute OT needs identified; signing off at this time. Please re-consult if needs change. Thank you for this referral.    Follow Up Recommendations  No OT follow up;Supervision - Intermittent    Equipment Recommendations  None recommended by OT    Recommendations for Other Services       Precautions / Restrictions Precautions Precautions: Cervical;Fall Precaution Comments: Educated pt on cervical precautions and provided handout. Required Braces or Orthoses: Cervical Brace Cervical Brace: Hard collar Restrictions Weight Bearing Restrictions: No      Mobility Bed Mobility Overal bed mobility: Needs Assistance Bed Mobility: Rolling;Sidelying to Sit Rolling: Supervision Sidelying to sit: Supervision       General bed mobility comments: Supervision for safety; no physical assist required. VCs for log roll technique.  Transfers Overall transfer level: Needs assistance Equipment used: None Transfers: Sit to/from Stand Sit to Stand: Supervision         General transfer comment: Supervision for safety. No physical assist required.    Balance Overall balance assessment: No apparent balance deficits (not formally assessed)                                          ADL Overall ADL's : Needs assistance/impaired Eating/Feeding: Modified independent;Sitting   Grooming:  Supervision/safety;Standing Grooming Details (indicate cue type and reason): Educated pt on use of 2 cups for oral care Upper Body Bathing: Set up;Supervision/ safety;Sitting   Lower Body Bathing: Set up;Supervison/ safety   Upper Body Dressing : Supervision/safety;Sitting   Lower Body Dressing: Supervision/safety;Sit to/from stand   Toilet Transfer: Supervision/safety;Ambulation;Regular Toilet   Toileting- ArchitectClothing Manipulation and Hygiene: Supervision/safety;Sit to/from stand   Tub/ Shower Transfer: Supervision/safety;Tub transfer;Ambulation   Functional mobility during ADLs: Supervision/safety General ADL Comments: Pt c/o slight dizziness with initial positional changes; resolved quickly with sitting EOB. Educated pt on cervical precautions and maintaining during functional activities.       Vision Vision Assessment?: No apparent visual deficits   Perception     Praxis      Pertinent Vitals/Pain Pain Assessment: 0-10 Pain Score: 2  Pain Location: neck Pain Descriptors / Indicators: Sore Pain Intervention(s): Monitored during session;Premedicated before session     Hand Dominance Right   Extremity/Trunk Assessment Upper Extremity Assessment Upper Extremity Assessment: Overall WFL for tasks assessed   Lower Extremity Assessment Lower Extremity Assessment: Defer to PT evaluation   Cervical / Trunk Assessment Cervical / Trunk Assessment: Other exceptions Cervical / Trunk Exceptions: s/p cervical sx   Communication Communication Communication: No difficulties   Cognition Arousal/Alertness: Awake/alert Behavior During Therapy: WFL for tasks assessed/performed Overall Cognitive Status: Within Functional Limits for tasks assessed                     General Comments       Exercises  Shoulder Instructions      Home Living Family/patient expects to be discharged to:: Private residence Living Arrangements: Spouse/significant other Available Help  at Discharge: Family Type of Home: House Home Access: Level entry     Home Layout: Two level;Bed/bath upstairs Alternate Level Stairs-Number of Steps: 6+6   Bathroom Shower/Tub: Tub/shower unit Shower/tub characteristics: Engineer, building servicesCurtain Bathroom Toilet: Standard     Home Equipment: None          Prior Functioning/Environment Level of Independence: Independent        Comments: 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    OT Diagnosis: Acute pain   OT Problem List:     OT Treatment/Interventions:      OT Goals(Current goals can be found in the care plan section) Acute Rehab OT Goals Patient Stated Goal: return home OT Goal Formulation: All assessment and education complete, DC therapy  OT Frequency:     Barriers to D/C:            Co-evaluation              End of Session Equipment Utilized During Treatment: Cervical collar Nurse Communication: Mobility status;Other (comment) (no equipment needs)  Activity Tolerance: Patient tolerated treatment well Patient left: in bed;with call bell/phone within reach (sitting EOB)   Time: [id number] OT Time Calculation (min): 10 min Charges:  OT General Charges $OT Visit: 1 Procedure OT Evaluation $OT Eval Low Complexity: 1 Procedure G-Codes: OT G-codes **NOT FOR INPATIENT CLASS** Functional Assessment Tool Used: Clinical judgement Functional Limitation: Self care Self Care Current Status ([id number]): At least 1 percent but less than 20 percent impaired, limited or restricted Self Care Goal Status ([id number]): At least 1 percent but less than 20 percent impaired, limited or restricted Self Care Discharge Status [phone](G8989): At least 1 percent but less than 20 percent impaired, limited or restricted   Gaye AlkenBailey A Kura Bethards M.S., OTR/L Pager: [phone]  05/17/2016, 5:19 PM

## 2016-05-17 NOTE — Anesthesia Procedure Notes (Signed)
Procedure Name: Intubation Date/Time: 05/17/2016 8:36 AM Performed by: Bonita QuinGUIDETTI, RICHARD S Pre-anesthesia Checklist: Patient identified, Emergency Drugs available, Suction available and Patient being monitored Patient Re-evaluated:Patient Re-evaluated prior to inductionOxygen Delivery Method: Circle system utilized Preoxygenation: Pre-oxygenation with 100% oxygen Intubation Type: IV induction Ventilation: Oral airway inserted - appropriate to patient size Laryngoscope Size: Glidescope (T4) Grade View: Grade I Tube type: Oral Tube size: 7.5 mm Number of attempts: 1 Airway Equipment and Method: Patient positioned with wedge pillow and Video-laryngoscopy Placement Confirmation: ETT inserted through vocal cords under direct vision,  positive ETCO2 and breath sounds checked- equal and bilateral Secured at: 24 cm Dental Injury: Teeth and Oropharynx as per pre-operative assessment

## 2016-05-17 NOTE — Evaluation (Signed)
Physical Therapy Evaluation Patient Details Name: Spencer RenderCharles Josephs MRN: [id number] DOB: 05-27-1956 Today's Date: 05/17/2016   History of Present Illness  Pt is a 60 y.o. male s/p C5-7 ACDF. No pertinent PMHx in chart.  Clinical Impression  Patient seen for mobility assessment and education s/p spinal surgery. Education complete. Patient mobilizing well, performed stairs, no further acute PT needs. Will sign off.    Follow Up Recommendations No PT follow up    Equipment Recommendations  None recommended by PT    Recommendations for Other Services       Precautions / Restrictions Precautions Precautions: Cervical;Fall Precaution Comments: Educated pt on cervical precautions and provided handout. Required Braces or Orthoses: Cervical Brace Cervical Brace: Hard collar Restrictions Weight Bearing Restrictions: No      Mobility  Bed Mobility Overal bed mobility: Needs Assistance Bed Mobility: Rolling;Sidelying to Sit Rolling: Supervision Sidelying to sit: Supervision       General bed mobility comments: Supervision for safety; no physical assist required. VCs for log roll technique.  Transfers Overall transfer level: Needs assistance Equipment used: None Transfers: Sit to/from Stand Sit to Stand: Supervision         General transfer comment: Supervision for safety. No physical assist required.  Ambulation/Gait Ambulation/Gait assistance: Supervision Ambulation Distance (Feet): 360 Feet Assistive device: None Gait Pattern/deviations: Step-through pattern;Drifts right/left Gait velocity: decreased Gait velocity interpretation: Below normal speed for age/gender General Gait Details: some instability noted with ambulation, increased lateral drift, some noted balance checks but no overt LOB and no physical assist required  Stairs Stairs: Yes Stairs assistance: Supervision Stair Management: Forwards Number of Stairs: 12 General stair comments: VCs for blind  negotiation  Wheelchair Mobility    Modified Rankin (Stroke Patients Only)       Balance Overall balance assessment:  (modest instability )                                           Pertinent Vitals/Pain Pain Assessment: 0-10 Pain Score: 2  Pain Location: neck Pain Descriptors / Indicators: Sore Pain Intervention(s): Monitored during session;Premedicated before session    Home Living Family/patient expects to be discharged to:: Private residence Living Arrangements: Spouse/significant other Available Help at Discharge: Family Type of Home: House Home Access: Level entry     Home Layout: Two level;Bed/bath upstairs Home Equipment: None      Prior Function Level of Independence: Independent         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     Hand Dominance   Dominant Hand: Right    Extremity/Trunk Assessment   Upper Extremity Assessment: Overall WFL for tasks assessed           Lower Extremity Assessment: Overall WFL for tasks assessed      Cervical / Trunk Assessment: Other exceptions  Communication   Communication: No difficulties  Cognition Arousal/Alertness: Awake/alert Behavior During Therapy: WFL for tasks assessed/performed Overall Cognitive Status: Within Functional Limits for tasks assessed                      General Comments      Exercises        Assessment/Plan    PT Assessment Patent does not need any further PT services  PT Diagnosis Acute pain   PT Problem List    PT Treatment Interventions     PT Goals (  Current goals can be found in the Care Plan section) Acute Rehab PT Goals Patient Stated Goal: return home PT Goal Formulation: All assessment and education complete, DC therapy    Frequency     Barriers to discharge        Co-evaluation               End of Session Equipment Utilized During Treatment: Cervical collar Activity Tolerance: Patient tolerated treatment  well Patient left:  (with OT) Nurse Communication: Mobility status    Functional Assessment Tool Used: clinical judgement Functional Limitation: Mobility: Walking and moving around Mobility: Walking and Moving Around Current Status ([id number]([id number]): At least 1 percent but less than 20 percent impaired, limited or restricted Mobility: Walking and Moving Around Goal Status [phone]): At least 1 percent but less than 20 percent impaired, limited or restricted Mobility: Walking and Moving Around Discharge Status [phone]([phone]): At least 1 percent but less than 20 percent impaired, limited or restricted    Time: 1655-1705 PT Time Calculation (min) (ACUTE ONLY): 10 min   Charges:   PT Evaluation $PT Eval Low Complexity: 1 Procedure     PT G Codes:   PT G-Codes **NOT FOR INPATIENT CLASS** Functional Assessment Tool Used: clinical judgement Functional Limitation: Mobility: Walking and moving around Mobility: Walking and Moving Around Current Status ([id number](G8978): At least 1 percent but less than 20 percent impaired, limited or restricted Mobility: Walking and Moving Around Goal Status [phone]([phone]): At least 1 percent but less than 20 percent impaired, limited or restricted Mobility: Walking and Moving Around Discharge Status [phone](G8980): At least 1 percent but less than 20 percent impaired, limited or restricted    Fabio AsaWerner, Damani Rando J 05/17/2016, 5:22 PM Charlotte Crumbevon Cobe Viney, PT DPT  [phone]

## 2016-05-17 NOTE — Discharge Instructions (Signed)

## 2016-05-17 NOTE — Brief Op Note (Signed)
05/17/2016  11:32 AM  PATIENT:  Spencer Sampson  60 y.o. male  PRE-OPERATIVE DIAGNOSIS:  CERVICAL SPONDYLOSIS WITH RADICULOPATHY  POST-OPERATIVE DIAGNOSIS:  CERVICAL SPONDYLOSIS WITH RADICULOPATHY  PROCEDURE:  Procedure(s): Anterior Cervical Discectomy and Fusion Cervical five through Cervical Seven (N/A)  SURGEON:  Surgeon(s) and Role:    * Venita Lickahari Edin Kon, MD - Primary  PHYSICIAN ASSISTANT:   ASSISTANTS: Carmen Mayo   ANESTHESIA:   general  EBL:  Total I/O In: 2000 [I.V.:2000] Out: 2956 [[id number] [Urine:1675; Blood:50]  BLOOD ADMINISTERED:none  DRAINS: none   LOCAL MEDICATIONS USED:  MARCAINE     SPECIMEN:  No Specimen  DISPOSITION OF SPECIMEN:  N/A  COUNTS:  YES  TOURNIQUET:  * No tourniquets in log *  DICTATION: .Other Dictation: Dictation Number [id number]  PLAN OF CARE: Admit for overnight observation  PATIENT DISPOSITION:  PACU - hemodynamically stable.

## 2016-05-17 NOTE — H&P (Signed)
History of Present Illness  The patient is a 60 year old male who comes in today for a preoperative History and Physical. The patient is scheduled for a ACDF C5-6, C6-7 to be performed by Dr. Debria Garretahari D. Shon BatonBrooks, [facility]-17-[facility] . Please see the hospital record for complete dictated history and physical.  Problem List/Past Medical Problems Reconciled  Right cervical radiculopathy (M54.12)  Cervical pain (M54.2)   Allergies  No Known Drug Allergies [02/07/2016]: Allergies Reconciled   Family History  Cancer  Mother. Diabetes Mellitus  Sister. Hypertension  Father, Sister.  Social History  Tobacco use  Never smoker. 02/07/2016 Tobacco / smoke exposure  02/07/2016: no Children  3 Current work status  working full time Exercise  Exercises daily; does running / walking Living situation  live with spouse Marital status  married Never consumed alcohol  02/07/2016: Never consumed alcohol No history of drug/alcohol rehab  Not under pain contract  Number of flights of stairs before winded  greater than 5  Medication History  Vitamin C (500MG  Tablet, Oral) Active. (qd) Baclofen (10MG  Tablet, Oral) Active. Zolpidem Tartrate (10MG  Tablet, Oral) Active. Medications Reconciled  Other Problems High blood pressure   Vitals  05/09/2016 8:50 AM Weight: 204 lb Height: 72in Body Surface Area: 2.15 m Body Mass Index: 27.67 kg/m  Temp.: 97.35F(Oral)  BP: 173/96 (Sitting, Right Arm, Standard)  General General Appearance-Not in acute distress. Orientation-Oriented X3. Build & Nutrition-Well nourished and Well developed.  Integumentary General Characteristics Surgical Scars - no surgical scar evidence of previous cervical surgery. Cervical Spine-Skin examination of the cervical spine is without deformity, skin lesions, lacerations or abrasions.  Chest and Lung Exam Auscultation Breath sounds - Normal and  Clear.  Cardiovascular Auscultation Rhythm - Regular rate and rhythm.  Peripheral Vascular Upper Extremity Palpation - Radial pulse - Bilateral - 2+.  Neurologic Sensation Upper Extremity - Left - sensation is intact in the upper extremity. Right - sensation is diminished in the upper extremity. Reflexes Biceps Reflex - Bilateral - 2+. Brachioradialis Reflex - Bilateral - 2+. Triceps Reflex - Bilateral - 2+. Hoffman's Sign - Bilateral - Hoffman's sign not present.  Musculoskeletal Spine/Ribs/Pelvis  Cervical Spine : Inspection and Palpation - Tenderness - right cervical paraspinals tender to palpation, left cervical paraspinals tender to palpation and right trapezius tender to palpation. Strength and Tone: Strength - Deltoid - Bilateral - 5/5. Biceps - Bilateral - 5/5. Triceps - Bilateral - 5/5. Wrist Extension - Bilateral - 5/5. Hand Grip - Bilateral - 5/5. Heel walk - Bilateral - able to heel walk without difficulty. Toe Walk - Bilateral - able to walk on toes without difficulty. Heel-Toe Walk - Bilateral - able to heel-toe walk without difficulty. ROM - Flexion - Mildly decreased and painful. Extension - Mildly decreased and painful. Left Lateral Flexion - Mildly decreased and painful. Right Lateral Flexion - Mildly decreased and painful. Left Rotation - Mildly decreased and painful. Right Rotation - Mildly decreased and painful. Pain - . Cervical Spine - Special Testing - axial compression test negative, cross chest impingement test negative. Non-Anatomic Signs - No non-anatomic signs present. Upper Extremity Range of Motion - No truesholder pain with IR/ER of the shoulders.   MRI from 02/10/2016, of the cervical spine was reviewed. He has normal spinal cord signal, no changes. He has a congenital C2-3 fusion. No significant stenosis. 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 He also with severe right foraminal stenosis at  C5-6. He has severe right foraminal  stenosis at C6-7 as well. Assessment & Plan   Anterior cervical fusion:Risks of surgery include, but are not limited to: Throat pain, swallowing difficulty, hoarseness or change in voice, death, stroke, paralysis, nerve root damage/injury, bleeding, blood clots, loss of bowel/bladder control, hardware failure, or mal-position, spinal fluid leak, adjacent segment disease, non-union, need for further surgery, ongoing or worse pain, infection. Post-operative bleeding or swelling that could require emergent surgery.  Goal Of Surgery: Discussed that goal of surgery is to reduce pain and improve function and quality of life. Patient is aware that despite all appropriate treatment that there pain and function could be the same, worse, or different.  Patient with temporary relief with cervical SNRB.  Otherwise poor quality of life and increasing pain.  As a result elected to proceed with 2 level ACDF (C5-7) to address the C6 and C7 nerve pathology.

## 2016-05-18 DIAGNOSIS — M4722 Other spondylosis with radiculopathy, cervical region: Secondary | ICD-10-CM | POA: Diagnosis not present

## 2016-05-18 MED ORDER — LUBIPROSTONE 24 MCG PO CAPS
24.0000 ug | ORAL_CAPSULE | Freq: Two times a day (BID) | ORAL | Status: DC
  Administered 2016-05-18: 24 ug via ORAL
  Filled 2016-05-18: qty 1

## 2016-05-18 MED FILL — Thrombin For Soln 20000 Unit: CUTANEOUS | Qty: 1 | Status: AC

## 2016-05-18 NOTE — Progress Notes (Signed)
Patient alert and oriented, mae's well, voiding adequate amount of urine, swallowing without difficulty, no c/o pain. Patient discharged home with family. Script and discharged instructions given to patient. Patient and family stated understanding of d/c instructions given and has an appointment with MD. 

## 2016-05-18 NOTE — Progress Notes (Signed)
Patient ID: Spencer Sampson, male   DOB: August 28, 1956, 60 y.o.   MRN: [id number]    Subjective: 1 Day Post-Op Procedure(s) (LRB): Anterior Cervical Discectomy and Fusion Cervical five through Cervical Seven (N/A) Patient reports pain as 3 on 0-10 scale.   Denies CP or SOB.  Voiding without difficulty. Positive flatus. Pt ambulating w/o difficulty Objective: Vital signs in last 24 hours: Temp:  [97.6 F (36.4 C)-98.2 F (36.8 C)] 98.1 F (36.7 C) (08/17 0500) Pulse Rate:  [56-125] 75 (08/17 0500) Resp:  [9-20] 19 (08/17 0500) BP: (131-171)/(68-102) 156/94 (08/17 0500) SpO2:  [93 %-98 %] 96 % (08/17 0500) Weight:  [91.6 kg (202 lb)] 91.6 kg (202 lb) (08/16 0751)  Intake/Output from previous day: 08/16 0701 - 08/17 0700 In: 2900 [I.V.:2600] Out: 2275 [Urine:2225; Blood:50] Intake/Output this shift: No intake/output data recorded.  Labs: No results for input(s): HGB in the last 72 hours. No results for input(s): WBC, RBC, HCT, PLT in the last 72 hours. No results for input(s): NA, K, CL, CO2, BUN, CREATININE, GLUCOSE, CALCIUM in the last 72 hours. No results for input(s): LABPT, INR in the last 72 hours.  Physical Exam: Neurologically intact ABD soft Sensation intact distally Incision: no drainage Compartment soft Aspen collar in place Assessment/Plan: 1 Day Post-Op Procedure(s) (LRB): Anterior Cervical Discectomy and Fusion Cervical five through Cervical Seven (N/A) Advance diet Up with therapy  DC home today  Mayo, Baxter Kailarmen Christina for Dr. Venita Lickahari Brooks [facility] [phone] 05/18/2016, 7:29 AM

## 2016-05-22 ENCOUNTER — Encounter (HOSPITAL_COMMUNITY): Payer: Self-pay | Admitting: Orthopedic Surgery

## 2016-05-25 NOTE — Discharge Summary (Signed)
Physician Discharge Summary  Patient ID: Spencer Sampson Dismore MRN: [id number] DOB/AGE: [birth year] 60 y.o.  Admit date: 05/17/2016 Discharge date: 05/25/2016  Admission Diagnoses:  Cervical DDD and radicular pain  Discharge Diagnoses:  Active Problems:   Neck pain   Past Medical History:  Diagnosis Date  . Headache     Surgeries: Procedure(s): Anterior Cervical Discectomy and Fusion Cervical five through Cervical Seven on 05/17/2016   Consultants (if any):   Discharged Condition: Improved  Hospital Course: Spencer Sampson Oboyle is an 60 y.o. male who was admitted 05/17/2016 with a diagnosis of Cervical DDD and went to the operating room on 05/17/2016 and underwent the above named procedures.  Urinating without difficulty, positive flatus.  Pt ambulating well. Cleared by PT to be discharged.  Pt eating well. No n/v prior to DC. Pain controlled on oral meds.  He was given perioperative antibiotics:  Anti-infectives    Start     Dose/Rate Route Frequency Ordered Stop   05/17/16 1700  ceFAZolin (ANCEF) IVPB 1 g/50 mL premix     1 g 100 mL/hr over 30 Minutes Intravenous Every 8 hours 05/17/16 1444 05/17/16 2356   05/17/16 0800  ceFAZolin (ANCEF) IVPB 2g/100 mL premix     2 g 200 mL/hr over 30 Minutes Intravenous To ShortStay [facility] 05/16/16 1043 05/17/16 0840    .  He was given sequential compression devices, early ambulation, and TED for DVT prophylaxis.  He benefited maximally from the hospital stay and there were no complications.    Recent vital signs:  Vitals:   05/18/16 0500 05/18/16 0804  BP: (!) 156/94 (!) 143/95  Pulse: 75 90  Resp: 19 18  Temp: 98.1 F (36.7 C) 98.2 F (36.8 C)    Recent laboratory studies:  Lab Results  Component Value Date   HGB 15.6 05/09/2016   Lab Results  Component Value Date   WBC 8.1 05/09/2016   PLT 188 05/09/2016   No results found for: INR Lab Results  Component Value Date   NA 139 05/09/2016   K 4.7 05/09/2016   CL 105 05/09/2016   CO2 27 05/09/2016   BUN 11 05/09/2016   CREATININE 0.98 05/09/2016   GLUCOSE 93 05/09/2016    Discharge Medications:     Medication List    STOP taking these medications   baclofen 10 MG tablet Commonly known as:  LIORESAL   vitamin C 500 MG tablet Commonly known as:  ASCORBIC ACID   zolpidem 10 MG tablet Commonly known as:  AMBIEN     TAKE these medications   methocarbamol 500 MG tablet Commonly known as:  ROBAXIN Take 1 tablet (500 mg total) by mouth 3 (three) times daily as needed for muscle spasms.   ondansetron 4 MG tablet Commonly known as:  ZOFRAN Take 1 tablet (4 mg total) by mouth every 8 (eight) hours as needed for nausea or vomiting.   oxyCODONE-acetaminophen 10-325 MG tablet Commonly known as:  PERCOCET Take 1 tablet by mouth every 4 (four) hours as needed for pain.       Diagnostic Studies: Dg Cervical Spine 2 Or 3 Views  Result Date: 05/17/2016 CLINICAL DATA:  Postop from anterior cervical disc fusion. EXAM: CERVICAL SPINE - 2-3 VIEW COMPARISON:  05/17/2016 FINDINGS: There is fusion between C2 and C3 vertebrae. Anterior fusion plate with fixation screws span C5-C7. There are metallic disc spacers well centered maintaining disc height at C5-C6 and C6-C7. The orthopedic hardware is well-seated with no evidence loosening. There is mild soft  tissue swelling anterior to the fusion hardware. IMPRESSION: Well-positioned orthopedic hardware following anterior C5 through C7 cervical disc fusion. Electronically Signed   By: Amie Portland M.D.   On: 05/17/2016 12:45   Dg Cervical Spine 2-3 Views  Result Date: 05/17/2016 CLINICAL DATA:  ACDF EXAM: CERVICAL SPINE - 2-3 VIEW; DG C-ARM 61-120 MIN COMPARISON:  None. FINDINGS: Three images show ACDF from C5 through C7. 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 Interbody fusion devices, anterior plate and screws well position. Lower extent poorly seen because of shoulder density. IMPRESSION: ACDF C5-C7.  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 Electronically Signed   By: Paulina Fusi M.D.   On: 05/17/2016 12:19   Dg C-arm 61-120 Min  Result Date: 05/17/2016 CLINICAL DATA:  ACDF EXAM: CERVICAL SPINE - 2-3 VIEW; DG C-ARM 61-120 MIN COMPARISON:  None. FINDINGS: Three images show ACDF from C5 through C7. 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 Interbody fusion devices, anterior plate and screws well position. Lower extent poorly seen because of shoulder density. IMPRESSION: ACDF C5-C7.  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 Electronically Signed   By: Paulina Fusi M.D.   On: 05/17/2016 12:19    Disposition: 01-Home or Self Care Pt will f/u in clinic in 2 weeks Pt will wear Aspen collar for next two weeks   Follow-up Information    BROOKS,DAHARI D, MD In 2 weeks.   Specialty:  Orthopedic Surgery Why:  For suture removal, If symptoms worsen, For wound re-check Contact information: [address] [place] [address] [phone]            Signed: Kirt Boys 05/25/2016, 11:04 AM

## 2017-11-15 IMAGING — CR DG CERVICAL SPINE 2 OR 3 VIEWS
2 series · 2 of 2 positions shown · non-contrast
Comparison: 05/17/2016

CLINICAL DATA: Postop from anterior cervical disc fusion.

EXAM:
CERVICAL SPINE - 2-3 VIEW

[AP (1 of 2)]
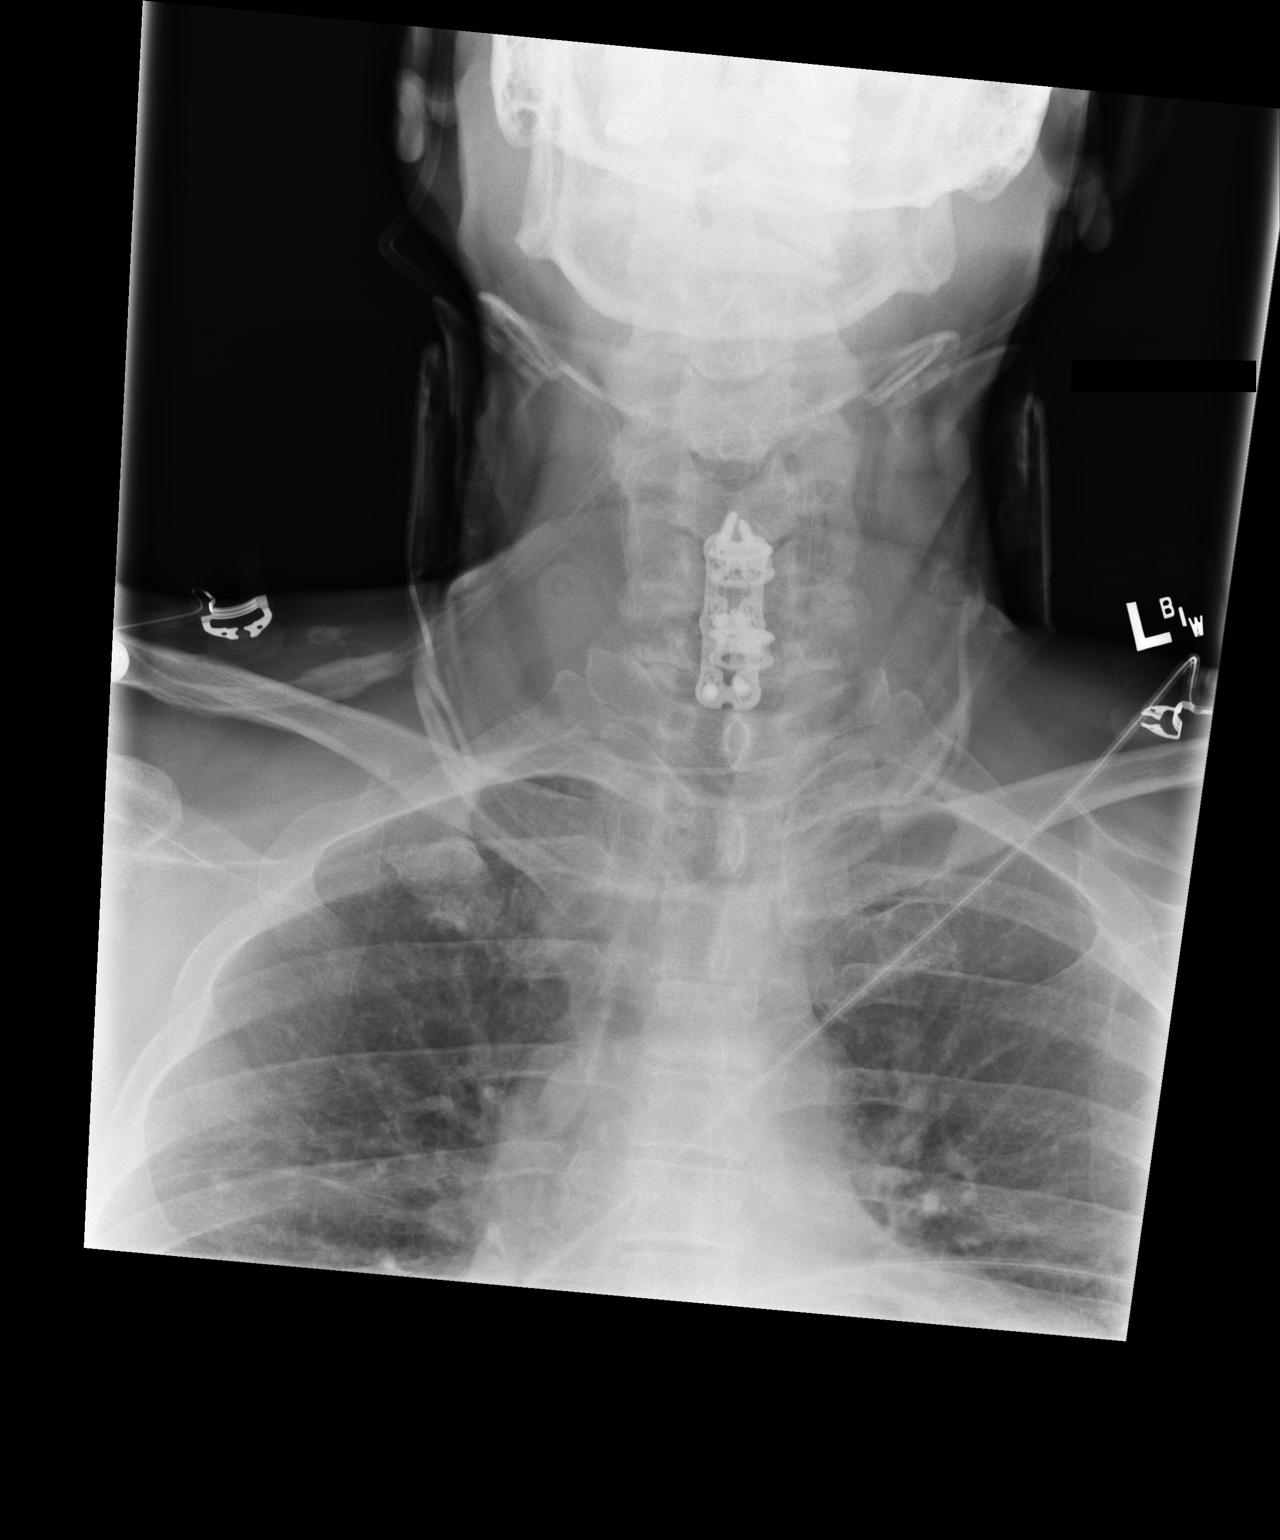

[AP (2 of 2)]
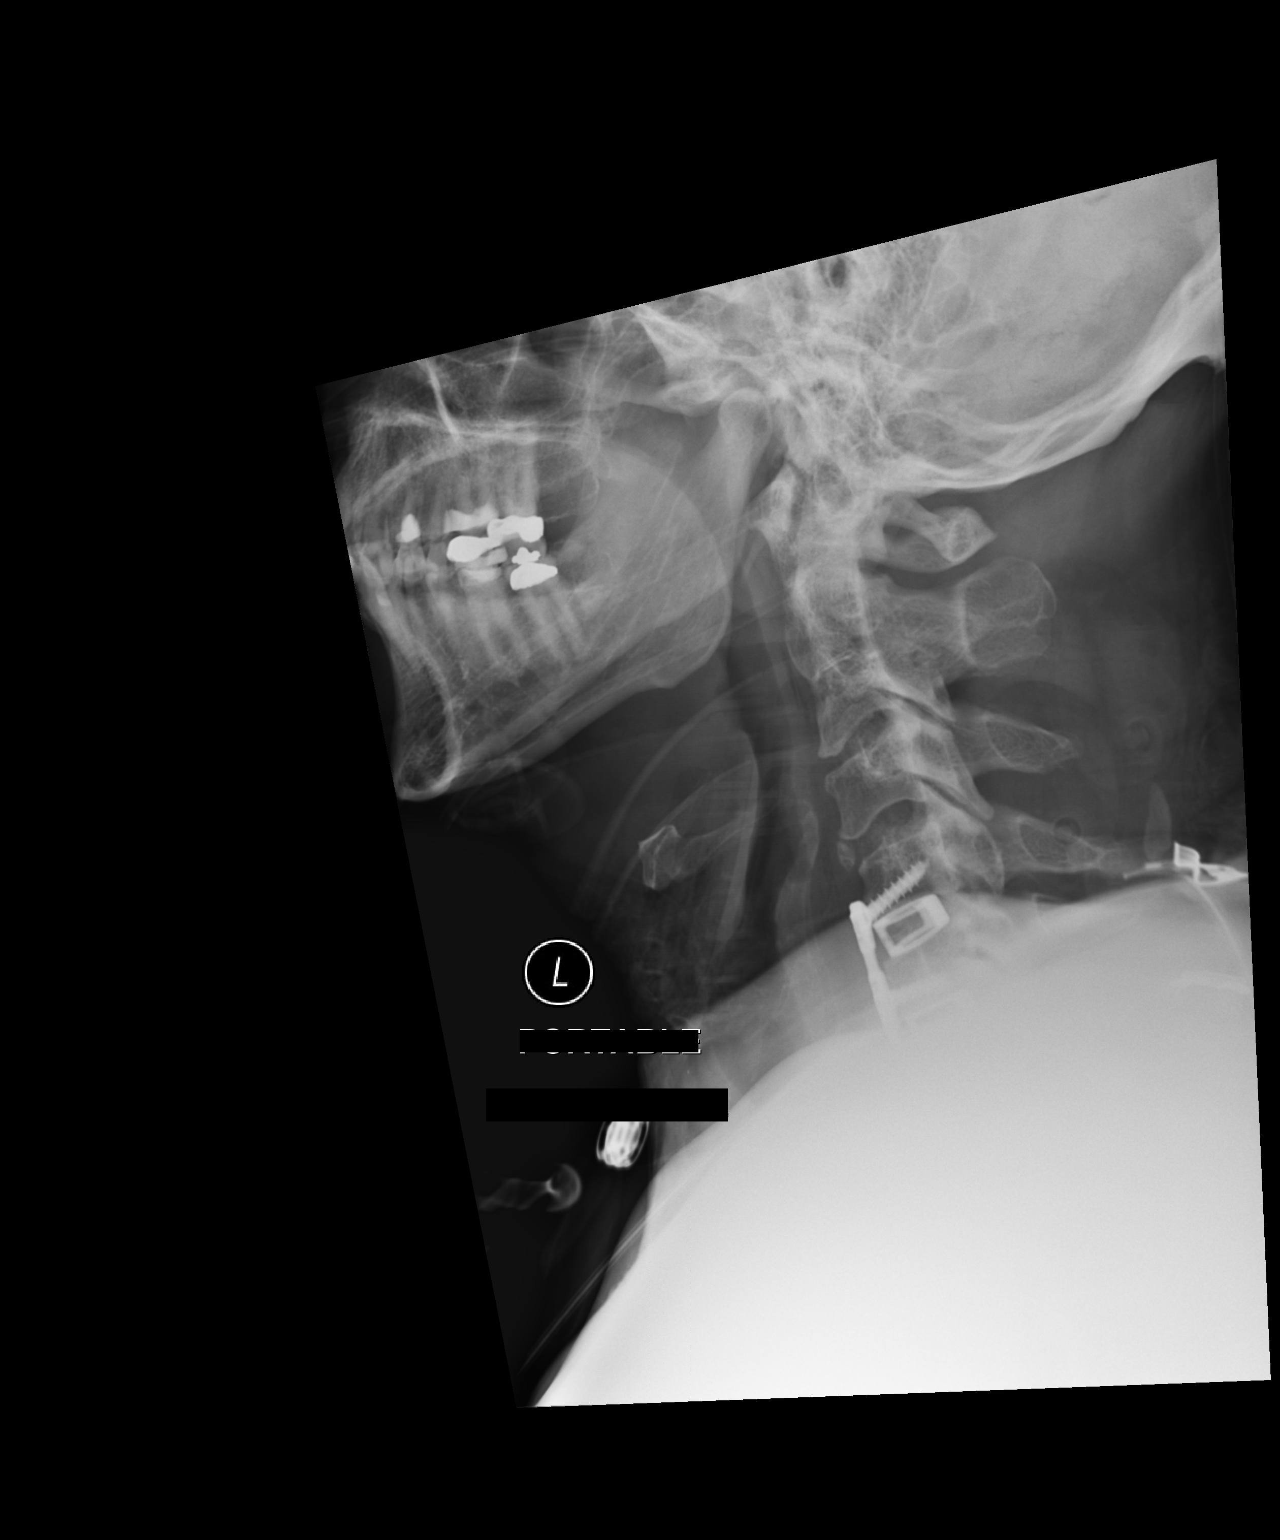

[2 of 2 positions shown; findings below may reference images not displayed]

FINDINGS: There is fusion between C2 and C3 vertebrae.

Anterior fusion plate with fixation screws span C5-C7. There are
metallic disc spacers well centered maintaining disc height at C5-C6
and C6-C7. The orthopedic hardware is well-seated with no evidence
loosening.

There is mild soft tissue swelling anterior to the fusion hardware.
IMPRESSION: Well-positioned orthopedic hardware following anterior C5 through C7
cervical disc fusion.
